# Patient Record
Sex: Male | Born: 2000 | Race: White | Hispanic: No | Marital: Single | Smoking: Never smoker
Health system: Southern US, Community
[De-identification: ages and names within clinical notes are randomized; demographics above are authoritative.]

## PROBLEM LIST (undated history)

## (undated) DIAGNOSIS — G43909 Migraine, unspecified, not intractable, without status migrainosus: Secondary | ICD-10-CM

## (undated) DIAGNOSIS — S62610A Displaced fracture of proximal phalanx of right index finger, initial encounter for closed fracture: Secondary | ICD-10-CM

## (undated) DIAGNOSIS — S62630G Displaced fracture of distal phalanx of right index finger, subsequent encounter for fracture with delayed healing: Secondary | ICD-10-CM

---
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
## 2018-08-03 ENCOUNTER — Emergency Department: Admit: 2018-08-04 | Payer: Medicaid Other

## 2018-08-03 DIAGNOSIS — S62306A Unspecified fracture of fifth metacarpal bone, right hand, initial encounter for closed fracture: Secondary | ICD-10-CM

## 2018-08-03 NOTE — ED Triage Notes (Signed)
Pt became angry & punched the floor with right hand/ Right hand swollen

## 2018-08-03 NOTE — Discharge Instructions (Addendum)
Ice and elevate

## 2018-08-03 NOTE — ED Provider Notes (Signed)
Independent [facility]    HPI:  08/03/18, Time: 10:56 PM         Clevland Cork is a 17 y.o. male presenting to urgency room for sharp constant right hand pain after he punched the floor a few hours prior to arrival.  He is complaining of pain swelling in the area.  He is right-hand dominant he denies any wrist pain weakness or paresthesias.  He does have a history of a boxer's fracture the same hand from several years ago.    Review of Systems:   Pertinent positives and negatives are stated within HPI, all other systems reviewed and are negative.          --------------------------------------------- PAST HISTORY ---------------------------------------------  Past Medical History:  has a past medical history of Migraine.    Past Surgical History:  has no past surgical history on file.    Social History:  reports that he has never smoked. He has never used smokeless tobacco. He reports that he does not drink alcohol or use drugs.    Family History: family history is not on file.     The patient???s home medications have been reviewed.    Allergies: Patient has no known allergies.    -------------------------------------------------- RESULTS -------------------------------------------------  All laboratory and radiology results have been personally reviewed by myself   LABS:  No results found for this visit on 08/03/18.    RADIOLOGY:  Interpreted by Radiologist.  XR HAND RIGHT (MIN 3 VIEWS)   Final Result      Right fifth metacarpal midshaft transverse fracture with mild mild   angulation towards the radial side.             ------------------------- NURSING NOTES AND VITALS REVIEWED ---------------------------   The nursing notes within the ED encounter and vital signs as below have been reviewed.   BP 126/78    Pulse 72    Temp 98.6 ??F (37 ??C) (Oral)    Resp 14    Ht 5\' 9"  (1.753 m)    Wt 130 lb (59 kg)    SpO2 99%    BMI 19.20 kg/m??   Oxygen Saturation Interpretation:  Normal      ---------------------------------------------------PHYSICAL EXAM--------------------------------------      Constitutional/General: Alert and oriented x3, well appearing, non toxic in NAD  Head: Normocephalic and atraumatic  Eyes: PERRL, EOMI  Mouth: Oropharynx clear, handling secretions, no trismus  Neck: Supple, full ROM,   Pulmonary: Lungs clear to auscultation bilaterally, no wheezes, rales, or rhonchi. Not in respiratory distress  Cardiovascular:  Regular rate and rhythm, no murmurs, gallops, or rubs. 2+ distal pulses  Extremities: R hand POP over distal 5th metacarpal with edema and ecchymosis, no finger crossover with flexion, no POP over wrist or snuff box  Skin: warm and dry without rash  Neurologic: GCS 15, speech clear, gait steady  Psych: Normal Affect      ------------------------------ ED COURSE/MEDICAL DECISION MAKING----------------------  Medications - No data to display      ED COURSE:       Medical Decision Making:   Patient appears in no acute distress.  I will x-ray his hand to rule out fracture.  He will be advised to ice elevate the hand to reduce swelling.  Patient placed in a short ulnar gutter OCL splint for fracture support.  He will be referred to orthopedics for continued care.  He was placed on naproxen for pain.    Counseling:   The emergency provider has spoken with  the patient and discussed today???s results, in addition to providing specific details for the plan of care and counseling regarding the diagnosis and prognosis.  Questions are answered at this time and they are agreeable with the plan.      --------------------------------- IMPRESSION AND DISPOSITION ---------------------------------    IMPRESSION  1. Closed nondisplaced fracture of fifth metacarpal bone of right hand, unspecified portion of metacarpal, initial encounter        DISPOSITION  Disposition: Discharge to home  Patient condition is good      NOTE: This report was transcribed using voice recognition  software. Every effort was made to ensure accuracy; however, inadvertent computerized transcription errors may be present       Kagan Mutchler D Marcellene Shivley, PA-C  08/03/18 [id number]

## 2018-08-04 ENCOUNTER — Inpatient Hospital Stay: Admit: 2018-08-04 | Discharge: 2018-08-04 | Disposition: A | Discharge status: Home / Self Care | Payer: Medicaid Other

## 2018-08-04 MED ORDER — NAPROXEN 500 MG PO TABS
500 MG | ORAL_TABLET | Freq: Two times a day (BID) | ORAL | 0 refills | Status: AC
Start: 2018-08-04 — End: 2018-08-10

## 2018-08-04 NOTE — Telephone Encounter (Signed)
Patient needs an ED f/u appt with Dr Ron AgeeGentile for Closed nondisplaced fracture of fifth metacarpal bone of right hand, unspecified portion of metacarpal. [facility]

## 2018-08-05 NOTE — Telephone Encounter (Signed)
Spoke with pts aunt and appt was made for 8/16 at 1000

## 2018-08-08 ENCOUNTER — Ambulatory Visit: Admit: 2018-08-08 | Discharge: 2018-08-08 | Payer: Medicaid Other | Attending: Orthopaedic Surgery

## 2018-08-08 DIAGNOSIS — S62326A Displaced fracture of shaft of fifth metacarpal bone, right hand, initial encounter for closed fracture: Secondary | ICD-10-CM

## 2018-08-08 NOTE — Progress Notes (Signed)
Department of Orthopedic Trauma Surgery  Office History & Physical Exam      CHIEF COMPLAINT:   Chief Complaint   Patient presents with   ??? Follow-Up from Hospital   ??? New Patient     right 5th digit fx, no pain       HISTORY OF PRESENT ILLNESS:                The patient is a 17 y.o. male who presents with right hand pain after punching the floor.  States it happened about a week ago he went to the ED and was placed into a splint.  He states that he has done this in the past and has broken that finger before.  He is right-hand dominant.  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  Denies any numbness tingling paresthesias.  He denies any other orthopedic complaints at this time       Past Medical History:        Diagnosis Date   ??? Migraine      Past Surgical History:    No past surgical history on file.  Current Medications:   No current facility-administered medications for this visit.   Allergies:  Patient has no known allergies.    Social History:   TOBACCO:   reports that he has never smoked. He has never used smokeless tobacco.  ETOH:   reports that he does not drink alcohol.  DRUGS:   reports that he does not use drugs.  ACTIVITIES OF DAILY LIVING:    OCCUPATION:    Family History:   No family history on file.    REVIEW OF SYSTEMS:   Skin: no abnormal pigmentation, rash  Eyes: no blurring or eye pain   Ears/Nose/Throat: no hearing loss, tinnitus  Respiratory: No increased work of breathing, no coughing  Cardiovascular: Brisk capillary refill bilaterally, well perfused extremities  Gastrointestinal: no nausea, vomiting  Neurologic: no paralysis, or seizures  Musculoskeletal: right hand      PHYSICAL EXAM:    VITALS:  BP 106/72 (Site: Left Upper Arm, Position: Sitting, Cuff Size: Large Adult)    Pulse 66    Ht 6' (1.829 m)    Wt 130 lb (59 kg)    BMI 17.63 kg/m??   Constitutional: Oriented to person, place, and time; Answer questions appropriately  HENT: Head: Normocephalic and atraumatic.   Eyes: EOMI,  PEARL  Neck: Supple, trachea midline  Cardiovascular: Brisk capillary refill to all extremities, extremities well perfused  Pulmonary/Chest: No increased work of breathing, no cough  Abdominal: Non-tender, non-distended  Neurologic:  Awake, alert and oriented in three planes. No gross deficits   Musculoskeletal:    Skin intact circumferentially   ?? There is some edema noted over the lateral aspect of the right hand over the fifth metacarpal.  ?? Positive TTP over the fifth metacarpal shaft  ?? Patient able to flex and extend all the fingers however he does have pain with flexion of the fifth finger.  ?? No pain with palpation or flexion extension of the wrist  ?? There is no angular deformity noted to the fifth digit  ?? Sensations intact to light touch in the radial and ulnar digital nerves to the fifth digit  ?? Patient is able to flex and extend the DIP, PIP, CMP of the fifth digit  ?? Sensation intact to light touch in the median, ulnar, radial nerve distributions of the hand  ?? +2/4 radial pulse  ??  Good capillary refill to the fifth digit      DATA:    CBC:   Lab Results   Component Value Date    WBC 10.8 12/01/2012    RBC 5.51 12/01/2012    HGB 15.0 12/01/2012    HCT 44.9 12/01/2012    MCV 81.6 12/01/2012    MCH 27.1 12/01/2012    MCHC 33.3 12/01/2012    RDW 13.6 12/01/2012    PLT 278 12/01/2012    MPV 8.8 12/01/2012     Radiology Review:   X-ray right hand  demonstrating a transverse fifth metacarpal shaft fracture with 20 degrees of anterior angulation.     IMPRESSION:  Right fifth metacarpal shaft fracture    PLAN:  1.  Nonweightbearing to the right upper extremity  2.  Splint was removed today and skin was examined.  A new ulnar gutter splint was applied  3.  Patient continue ice and elevation as needed  4.  Take over-the-counter medicine for pain control  5.  Follow-up in 3 weeks for repeat x-rays of the right hand      Orthopaedic Attending    I have seen and evaluated the patient with the resident and agree  with the above assessments on today's visit. I have performed the key components of the history and physical examination and concur completely with the findings and plans as documented above.    Patient with refracture through right fifth metacarpal.  Fracture line within tolerances.  No clinical digital rotation.  New ulnar gutter splint applied today with better mold.  Patient's wishes to continue with close treatment.  Discussed nonweightbearing.  We will see us back in 3 weeks for repeat evaluation with X OP.    Electronically signed by   Renold GentaJohn Jannett Schmall, DO  08/08/2018

## 2018-08-08 NOTE — Progress Notes (Deleted)
Subjective:      Patient ID: Sean Matthews is a 17 y.o. male.    HPI    Review of Systems    Objective:   Physical Exam    Assessment:      ***      Plan:      ***        Venia Carbonyan M Woodruff Skirvin, DO

## 2018-08-29 ENCOUNTER — Inpatient Hospital Stay: Admit: 2018-08-29 | Payer: Medicaid Other

## 2018-08-29 ENCOUNTER — Ambulatory Visit: Admit: 2018-08-29 | Discharge: 2018-09-02 | Payer: Medicaid Other | Attending: Physician Assistant

## 2018-08-29 DIAGNOSIS — S62326A Displaced fracture of shaft of fifth metacarpal bone, right hand, initial encounter for closed fracture: Secondary | ICD-10-CM

## 2018-08-29 NOTE — Patient Instructions (Signed)
WB:  Non-weight bearing  Brace: On at all times, can remove for bathing and therapy  PT: Attend therapy script provided  Follow up in 4 weeks with XR of the hand

## 2018-09-01 NOTE — Progress Notes (Signed)
Chief Complaint   Patient presents with   . Hand Pain     pt present today for right hand follow up. xrays completed.      DOI: 08/03/18 right 5th metacarpal fracture non op    SUBJECTIVE:  This is a 17 year old male presented to the office for recheck of a 5th metacarpal fracture treated nonoperatively.  Has remained in the splint, nonweightbearing.  Pain is controlled on exam.  Denies any new orthopedic complaints or paresthesias.  Denies any shortness of breath, chest pain, or calf pain.  Denies any constitutional symptoms.    Review of Systems -   General ROS: negative for - chills, fatigue, fever or night sweats  Respiratory ROS: no cough, shortness of breath, or wheezing  Cardiovascular ROS: no chest pain or dyspnea on exertion  Gastrointestinal ROS: no abdominal pain, change in bowel habits, or black or bloody stools  Genitourinary: no hematuria, dysuria, or incontinence   Musculoskeletal ROS:see above  Neurological ROS: no TIA or stroke symptoms       OBJECTIVE:   Alert and oriented X 3, no acute distress, respirations easy and unlabored with no audible wheezes, skin warm and dry, speech and dress appropriate for noted age, affect euthymic.    Extremity:  right Upper Extremity Exam:  Skin intact , not broken down, no signs of infection. No erythema/induration/fluctuence present.   mild swelling present. no ecchymosis present.   no tenderness to palpation over the 5th metcarpal. No palpable crepitus noted over fracture site.  No shortening or malrotation of the 5th finger  Palpable distal pulses, cap refill brisk in all digits. Demonstrates active range of motion of all digits. Motor function intact to anterior interosseous/posterior interosseous/ulnar distributions to hand. Sensation intact to touch in radial/ulnar/median nerve distributions to hand. Compartments supple throughout arm and forearm.    XR: 09/01/18   X-rays obtained of the right hand demonstrating 5th metacarpal fracture with callus formation  noted, no change in fracture alignment.  No acute osseous abnormalities.    BP 128/80   Pulse 60   Ht 5\' 9"  (1.753 m)   Wt 130 lb (59 kg)   BMI 19.20 kg/m     ASSESSMENT:     Diagnosis Orders   1. Closed displaced fracture of shaft of fifth metacarpal bone of right hand, initial encounter  External Referral To Occupational Therapy       PLAN:  Remain NWB  Wrist cock up hand based splint placed on patient to be worn at all times with the exception of bathing and therapy  PT: Attend therapy script provided  Follow up in 4 weeks with XR of the hand    Electronically signed by Renee Rival, PA-C on 09/01/2018 at 9:44 AM

## 2023-10-01 ENCOUNTER — Emergency Department: Admit: 2023-10-02

## 2023-10-01 ENCOUNTER — Inpatient Hospital Stay
Admit: 2023-10-01 | Discharge: 2023-10-02 | Disposition: A | Discharge status: Home / Self Care | Attending: Student in an Organized Health Care Education/Training Program

## 2023-10-01 DIAGNOSIS — S62610A Displaced fracture of proximal phalanx of right index finger, initial encounter for closed fracture: Secondary | ICD-10-CM

## 2023-10-01 NOTE — ED Notes (Signed)
Ice pack was applied.

## 2023-10-01 NOTE — ED Provider Notes (Incomplete)
[facility] EMERGENCY DEPARTMENT  EMERGENCY DEPARTMENT ENCOUNTER        Pt Name: Sean Matthews  MRN: [id number]  Birthdate [birth year]  Date of evaluation: 10/01/2023  Provider: Thelma Barge, DO  PCP: No primary care provider on file.  Note Started: 8:55 PM EDT 10/01/23    CHIEF COMPLAINT       Chief Complaint   Patient presents with    Hand Injury     Right index finger was smashed with a piece of wood at least 120#, happened about 1630       HISTORY OF PRESENT ILLNESS: 1 or more Elements   History From: Patient    Sean Matthews is a 22 y.o. male presents to the emerged part with right hand injury.  Patient states that at approximately 1600 this afternoon his first digit as well as the metacarpal region of the right hand was smashed by a heavy 120 pound plywood board.  Patient states that he only has mild pain at this time, feels like his fingers about to burst, does report small laceration to the anterior and posterior portion of the pointer finger.  Swelling has increased, he states he is not able to move his finger, and that it is not due to pain.  Patient denies all other symptoms.      Nursing Notes were all reviewed and agreed with or any disagreements were addressed in the HPI.      REVIEW OF EXTERNAL NOTES :       PDMP Monitoring:    Last PDMP Alisyn Lequire as Reviewed:  Review User Review Instant Review Result            Urine Drug Screenings (1 yr)    No resulted procedures found.       Medication Contract and Consent for Opioid Use Documents Filed        No documents found                      REVIEW OF SYSTEMS :      Positives and Pertinent negatives as per HPI.     SURGICAL HISTORY     No past surgical history on file.    CURRENTMEDICATIONS       Previous Medications    No medications on file       ALLERGIES       Patient has no known allergies.    FAMILYHISTORY       No family history on file.     SOCIAL HISTORY       Social History     Tobacco Use    Smoking  status: Never    Smokeless tobacco: Never   Vaping Use    Vaping status: Never Used   Substance Use Topics    Alcohol use: No    Drug use: No       SCREENINGS          Glasgow Coma Scale  Eye Opening: Spontaneous  Best Verbal Response: Oriented  Best Motor Response: Obeys commands  Glasgow Coma Scale Score: 15                CIWA Assessment  BP: (!) 140/73  Pulse: 94           PHYSICAL EXAM  1 or more Elements     ED Triage Vitals   BP Systolic BP Percentile Diastolic BP Percentile Temp Temp src Pulse Respirations SpO2  10/01/23 2017 -- -- 10/01/23 1921 -- 10/01/23 1921 10/01/23 2017 10/01/23 1921   (!) 140/73   98.6 F (37 C)  94 16 98 %      Height Weight - Scale         -- 10/01/23 2017          59 kg (130 lb)             {Please review PE section, Press Backspace or delete to continue:***}    Physical Exam  Constitutional:       Appearance: Normal appearance.   Cardiovascular:      Rate and Rhythm: Normal rate and regular rhythm.   Musculoskeletal:         General: Swelling (Right first digit and base of right first digit in the metacarpal region.), tenderness and signs of injury present.      Comments: Compartments soft and compressible   Skin:     General: Skin is warm and dry.      Capillary Refill: Capillary refill takes less than 2 seconds.      Findings: Bruising, erythema and lesion (Laceration on anterior and posterior portion of right first digit.) present.   Neurological:      General: No focal deficit present.      Mental Status: He is alert and oriented to person, place, and time.      Sensory: No sensory deficit.          DIAGNOSTIC RESULTS   LABS:    Labs Reviewed - No data to display    As interpreted by me, the above displayed labs are abnormal. All other labs obtained during this visit were within normal range or not returned as of this dictation.      EKG Interpretation  Interpreted by emergency department physician, Thelma Barge, DO      RADIOLOGY:   Non-plain film images such as CT,  Ultrasound and MRI are read by the radiologist. Plain radiographic images are visualized and preliminarily interpreted by the ED Provider with the below findings:        Interpretation per the Radiologist below, if available at the time of this note:    XR HAND RIGHT (MIN 3 VIEWS)    (Results Pending)     No results found.    No results found.    PROCEDURES     Unless otherwise noted below, none       PAST MEDICAL HISTORY/Chronic Conditions Affecting Care      has a past medical history of Migraine.     EMERGENCY DEPARTMENT COURSE    Vitals:    Vitals:    10/01/23 1921 10/01/23 2017   BP:  (!) 140/73   Pulse: 94    Resp:  16   Temp: 98.6 F (37 C)    SpO2: 98%    Weight:  59 kg (130 lb)       Patient was given the following medications:  Medications   oxyCODONE (ROXICODONE) immediate release tablet 5 mg (5 mg Oral Given 10/01/23 2053)   tetanus & diphtheria toxoids (adult) 5-2 LFU injection 0.5 mL (0.5 mLs IntraMUSCular Given 10/01/23 2054)             Medical Decision Making/Differential Diagnosis:    CC/HPI Summary, Social Determinants of health, Records Reviewed, DDx, testing done/not done, ED Course, Reassessment, disposition considerations/shared decision making with patient, consults, disposition:           Sean Matthews is a  22 y.o. male who presents for ***    CONSULTS: (Who and What was discussed)  None          FINAL IMPRESSION    No diagnosis found.      DISPOSITION/PLAN     DISPOSITION      DISPOSITION  Disposition: {Plan; disposition:60083}  Patient condition is {condition:18240}    10/01/23, 8:55 PM EDT.    Thelma Barge, PGY-1  Emergency Medicine    PATIENT REFERRED TO:  No follow-up provider specified.    DISCHARGE MEDICATIONS:  New Prescriptions    No medications on file       DISCONTINUED MEDICATIONS:  Discontinued Medications    NAPROXEN (NAPROSYN) 500 MG TABLET    Take 1 tablet by mouth 2 times daily for 7 days              (Please note that portions of this note were completed with a  voice recognition program.  Efforts were made to edit the dictations but occasionally words are mis-transcribed.)    Thelma Barge, DO (electronically signed)

## 2023-10-02 ENCOUNTER — Emergency Department: Admit: 2023-10-02

## 2023-10-02 MED ORDER — KETOROLAC TROMETHAMINE 15 MG/ML IJ SOLN
15 | Freq: Once | INTRAMUSCULAR | Status: DC
Start: 2023-10-02 — End: 2023-10-01

## 2023-10-02 MED ORDER — OXYCODONE HCL 5 MG PO TABS
5 | Freq: Once | ORAL | Status: AC
Start: 2023-10-02 — End: 2023-10-01
  Administered 2023-10-02: 01:00:00 5 mg via ORAL

## 2023-10-02 MED ORDER — OXYCODONE HCL 5 MG PO TABS
5 | ORAL_TABLET | Freq: Four times a day (QID) | ORAL | 0 refills | Status: AC | PRN
Start: 2023-10-02 — End: 2023-10-05

## 2023-10-02 MED ORDER — TETANUS-DIPHTHERIA TOXOIDS TD 5-2 LFU IM INJ
5-2 | Freq: Once | INTRAMUSCULAR | Status: AC
Start: 2023-10-02 — End: 2023-10-01
  Administered 2023-10-02: 01:00:00 0.5 mL via INTRAMUSCULAR

## 2023-10-02 MED ORDER — LIDOCAINE HCL 1 % IJ SOLN
1 | Freq: Once | INTRAMUSCULAR | Status: AC
Start: 2023-10-02 — End: 2023-10-02
  Administered 2023-10-02: 05:00:00 10 mL via INTRADERMAL

## 2023-10-02 MED FILL — TENIVAC 5-2 LFU IM INJ: 5-2 LFU | INTRAMUSCULAR | Qty: 0.5

## 2023-10-02 MED FILL — OXYCODONE HCL 5 MG PO TABS: 5 MG | ORAL | Qty: 1

## 2023-10-02 MED FILL — LIDOCAINE HCL 1 % IJ SOLN: 1 % | INTRAMUSCULAR | Qty: 10

## 2023-10-02 MED FILL — KETOROLAC TROMETHAMINE 15 MG/ML IJ SOLN: 15 MG/ML | INTRAMUSCULAR | Qty: 1

## 2023-10-02 NOTE — Discharge Instructions (Addendum)
Please keep splint and buddy tape in place until you see orthopedic surgery.

## 2023-10-14 ENCOUNTER — Ambulatory Visit: Admit: 2023-10-14 | Discharge: 2023-10-14 | Attending: Orthopaedic Surgery

## 2023-10-14 DIAGNOSIS — S62610A Displaced fracture of proximal phalanx of right index finger, initial encounter for closed fracture: Secondary | ICD-10-CM

## 2023-10-14 NOTE — Progress Notes (Signed)
Sean Matthews is a 22 y.o. male, who presents   Chief Complaint   Patient presents with    Follow-up     Right hand inury f/u. Patient states pain is better than it was. States no pain today.      I.  HPI:: Injury occurred 13 days ago.  [name] was working with a Radio broadcast assistant and the board was dropped and he hurt his right index finger.  He went to [facility] and was x-rayed and "reduced" and splinted.  He was told that he had a fracture.  He was apparently supposed to follow-up prior to today but did not have a ride    Allergies; medications; past medical, surgical, family, and social history; and problem list have been reviewed today and updated as indicated in this encounter - see below following Ortho specifics.    Musculoskeletal: The splint was dirty as was his dressing.  He has not had it off since he injured it.  This was removed and his finger was cleaned up.  He does not seem to have any openings that would suggest an open fracture.  He does have a skin abrasion.  There is tenderness to palpation and edema as expected.    Radiologic Studies: Imaging prereduction showed a displaced fracture of the proximal phalanx of the index finger.  "Postreduction" imaging showed displacement of the fracture as well.    ASSESSMENT:  There are no diagnoses linked to this encounter. Treatment alternatives were reviewed including medical and physical therapies, injections, and surgical options, expected risks benefits and likely outcome of each were discussed in detail, questions asked and answered and understood.  We discussed the incident as well as physical findings and imaging results.  This is a displaced fracture which will not heal and its present condition.  This needs to be reduced and fixated.  Malunion or nonunion are risks as well as infection and functional impairment.  These were all discussed with Sean Matthews with parent good understanding.  He would like to have this taken care of.    PLAN: Outpatient  closed versus open reduction and internal fixation proximal phalanx right index finger.        There is no problem list on file for this patient.      Past Medical History:   Diagnosis Date    Migraine        History reviewed. No pertinent surgical history.    No current outpatient medications on file.     No current facility-administered medications for this visit.       No Known Allergies    Social History     Socioeconomic History    Marital status: Single     Spouse name: None    Number of children: None    Years of education: None    Highest education level: None   Tobacco Use    Smoking status: Never    Smokeless tobacco: Never   Vaping Use    Vaping status: Never Used   Substance and Sexual Activity    Alcohol use: No    Drug use: No    Sexual activity: Never       History reviewed. No pertinent family history.      Review of Systems:   As follows except as previously noted in HPI:  Constitutional: Negative for chills, diaphoresis,  fever   Respiratory: Negative for cough, shortness of breath and wheezing.    Cardiovascular: Negative for chest pain and palpitations.  Neurological: Negative for dizziness, syncope,   GI / GU: abdominal pain or cramping  Musculoskeletal: see HPI       Objective:   Physical Exam   Constitutional: Oriented to person, place, and time. and appears well-developed and well-nourished. :   Head: Normocephalic and atraumatic.   Neck: Neck supple.  Eyes: EOM are normal.   Pulmonary/Chest: Effort normal.  No respiratory distress, no wheezes.   Neurological: Alert and oriented to person  Skin: Skin is warm and dry.               Quitman Livings, DO    10/14/23  1:44 PM    All reasonable efforts have been made to minimize the risk of errors that may occur in the use of voice recognition and other electronic means of charting.

## 2023-10-15 ENCOUNTER — Encounter

## 2023-10-15 ENCOUNTER — Ambulatory Visit

## 2023-10-15 NOTE — H&P (Signed)
History and Physical      CHIEF COMPLAINT: Painful deformed right index finger    HISTORY OF PRESENT ILLNESS:      Injured 14 days ago.  Piece of plywood was dropped on it.  Attempted reduction in ER and the finger was splinted.    Past Medical History:        Diagnosis Date    Migraine      Past Surgical History:    No past surgical history on file.  Social History:    TOBACCO:   reports that he has never smoked. He has never used smokeless tobacco.  ETOH:   reports no history of alcohol use.  DRUGS:   reports no history of drug use.  Family History:   No family history on file.  Medications Prior to Admission:  No medications prior to admission.  Allergies:  Patient has no known allergies.    CONSTITUTIONAL:  negative for  chills and anorexia  HEENT:  negative for  tinnitus  RESPIRATORY:  negative for  dyspnea and cyanosis  CARDIOVASCULAR:  negative for  palpitations, syncope  GASTROINTESTINAL:  negative for vomiting and hematemesis  GENITOURINARY:  negative for hematuria  ENDOCRINE:  negative for tremor  MUSCULOSKELETAL: Positive for  joint swelling and decreased range of motion, right index finger  NEUROLOGICAL:  negative for seizures and syncope,    BEHAVIOR/PSYCH:  negative for agitated and anxiety    PHYSICAL EXAM:  There were no vitals taken for this visit.  General appearance:  awake, alert, cooperative, no apparent distress, and appears stated age  Neurologic:  Awake, alert, oriented to name, place and time.  Cranial nerves II-XII are grossly intact.  Motor is 5 out of 5 bilaterally.  Cerebellar finger to nose, heel to shin intact.  Sensory is intact.  Babinski down going, Romberg negative, and gait is normal.  Lungs:  No increased work of breathing, good air exchange, clear to auscultation bilaterally, no crackles or wheezing  Heart:  Normal apical impulse, regular rate and rhythm, normal S1 and S2, no S3 or S4, and no murmur noted  Abdomen:  normal bowel sounds  Skin: warm and dry, no rash or  erythema  ENT: tympanic membrane, external ear and ear canal normal bilaterally, oropharynx clear and moist with normal mucous membranes  Musculoskeletal: Limited range of motion, with joint swelling, deformity and tenderness , right index finger    General Labs:  CBC:   Lab Results   Component Value Date/Time    WBC 10.8 12/01/2012 05:02 PM    RBC 5.51 12/01/2012 05:02 PM    HGB 15.0 12/01/2012 05:02 PM    HCT 44.9 12/01/2012 05:02 PM    MCV 81.6 12/01/2012 05:02 PM    RDW 13.6 12/01/2012 05:02 PM    PLT 278 12/01/2012 05:02 PM     CMP:    Lab Results   Component Value Date/Time    NA 137 12/01/2012 05:02 PM    Jerry Clyne 4.9 12/01/2012 05:02 PM    CL 99 12/01/2012 05:02 PM    CO2 25 12/01/2012 05:02 PM    BUN 16 12/01/2012 05:02 PM     U/A:  No components found for: "UCOLOR", "UCLARITY", "USPGRAV", "UPH", "UPROTEIN", "UGLUCOSE", "UKETONE", "UBILI", "UBLOOD", "UNITRITE", "UUROBIL", "ULEUKEST", "USQEPI", "URENEPI", "UWBC", "URBC", "UBACTERIA", "UHYALINE"    Radiology: Displaced transverse fracture proximal phalanx right index finger    ASSESSMENT AND PLAN:    Closed possible open reduction internal fixation right index finger  Electronically signed by Quitman Livings, DO on 10/15/2023 at 7:43 AM

## 2023-10-21 NOTE — Progress Notes (Signed)
[facility]                                                                                                                    PRE OP INSTRUCTIONS FOR  Sean Matthews        Date: 10/21/2023    Date of surgery: 10/22/23   Arrival Time: Hospital will call you between 5pm and 7pm with your final arrival time for surgery. Go to front of hospital and check in at information desk.    Nothing by mouth (NPO) as instructed. May have clear liquids up to 2 hours prior to surgery. Nothing solid after midnight. Examples: water, apple juice, black coffee, plain tea    Take the following medications with a small sip of water on the morning of Surgery: none     Diabetics may take half the evening dose of insulin but none after midnight.  If you feel symptomatic or have low blood sugar morning of surgery drink 1-2 ounces of apple juice only. If you take a weekly insulin injection _______________, stop 7 days prior to surgery. If you take _______________, stop 3-4 days prior to surgery.    Aspirin, Ibuprofen, Advil, Naproxen, other Anti-inflammatory products should be stopped before surgery as directed by your surgeon, cardiologist, or primary care Doctor. Herbal supplements and Vitamin E should be stopped five days prior.  May take Tylenol unless instructed otherwise by your surgeon.    Check with your Doctor regarding stopping Plavix, Coumadin, Lovenox, Eliquis, Effient, or other blood thinners such as, pradaxa, lixiana, xaralto and savaysa.    Do not smoke, chew tobacco, vape, or use illicit drugs and do not drink any alcoholic beverages 24 hours prior to surgery.    You may brush your teeth the morning of surgery.      You MUST make arrangements for a responsible adult, 18 and over, to take you home after your surgery. You will not be allowed to leave alone or drive yourself home.  You will need someone stay with you the first 24 hrs. Your surgery will be cancelled if you do not have a ride home or  someone to stay with you.    PEDIATRIC PATIENTS ONLY:  A parent/legal guardian must accompany a child scheduled for surgery and plan to stay at the hospital until the child is discharged.  Please do not bring other children with you.    Please wear simple, loose fitting clothing to the hospital.  Do not bring valuables (money, credit cards, checkbooks, etc.) Do not wear any makeup (including no eye makeup) or nail polish on your fingers or toes.    DO NOT wear any jewelry or piercings on day of surgery.  All body piercings and jewelry must be removed.    Shower the [facility] of surgery with an Antibacterial soap.    HYSTERECTOMY patients ONLY - Remember to bring the green Blood Bank bracelet to the hospital on the day of surgery.    If you have  a Living Will and/or Durable Power of Attorney for Healthcare, please bring in a copy.    If appropriate bring crutches, inspirex, walker, cane etc...    Notify your Surgeon if you develop any illness between now and surgery time, cough, cold, fever, sore throat, nausea, vomiting, etc.  Please notify your surgeon if you experience dizziness, shortness of breath or blurred vision between now & the time of your surgery.    If you wear dentures, they will need to be removed before going into surgery, we will provide you with a container. If you wear contact lenses or glasses, they will need to be removed, please bring a case for them.    To provide excellent care visitors will be limited to 1 person in the room at any given time.                                                                                         No children under the age of 69 are permitted in the hospital for the safety of all patients.     Any implantable device requiring remote therapy, Please bring remote day of surgery and bring your implant card with you!      21. If you use a C-PAP please bring settings with you, do not bring the machine.    22. Other:                     Please call AMBULATORY CARE if  you have any further questions.   Pre Admit Testing           [phone]     [facility] [phone]

## 2023-10-22 ENCOUNTER — Encounter

## 2023-10-22 ENCOUNTER — Inpatient Hospital Stay: Admit: 2023-10-22 | Attending: Orthopaedic Surgery

## 2023-10-22 ENCOUNTER — Inpatient Hospital Stay: Attending: Orthopaedic Surgery

## 2023-10-22 DIAGNOSIS — S62610A Displaced fracture of proximal phalanx of right index finger, initial encounter for closed fracture: Secondary | ICD-10-CM

## 2023-10-22 MED ORDER — MEPERIDINE HCL 50 MG/ML IJ SOLN
50 | Freq: Once | INTRAMUSCULAR | Status: DC
Start: 2023-10-22 — End: 2023-10-22

## 2023-10-22 MED ORDER — NALOXONE HCL 0.4 MG/ML IJ SOLN
0.4 | INTRAMUSCULAR | Status: DC | PRN
Start: 2023-10-22 — End: 2023-10-22

## 2023-10-22 MED ORDER — MIDAZOLAM HCL 2 MG/2ML IJ SOLN
2 | INTRAMUSCULAR | Status: AC
Start: 2023-10-22 — End: ?

## 2023-10-22 MED ORDER — STERILE WATER FOR INJECTION (MIXTURES ONLY)
1 | Freq: Three times a day (TID) | INTRAMUSCULAR | Status: DC
Start: 2023-10-22 — End: 2023-10-22
  Administered 2023-10-22: 18:00:00 2000 mg via INTRAVENOUS

## 2023-10-22 MED ORDER — SUGAMMADEX SODIUM 200 MG/2ML IV SOLN
200 | Freq: Once | INTRAVENOUS | Status: DC | PRN
Start: 2023-10-22 — End: 2023-10-22
  Administered 2023-10-22: 19:00:00 200 via INTRAVENOUS

## 2023-10-22 MED ORDER — SUGAMMADEX SODIUM 200 MG/2ML IV SOLN
200 | INTRAVENOUS | Status: AC
Start: 2023-10-22 — End: ?

## 2023-10-22 MED ORDER — FENTANYL CITRATE (PF) 100 MCG/2ML IJ SOLN
100 | Freq: Once | INTRAMUSCULAR | Status: DC | PRN
Start: 2023-10-22 — End: 2023-10-22
  Administered 2023-10-22: 18:00:00 50 via INTRAVENOUS
  Administered 2023-10-22: 18:00:00 100 via INTRAVENOUS

## 2023-10-22 MED ORDER — DEXAMETHASONE SOD PHOSPHATE PF 10 MG/ML IJ SOLN
10 | INTRAMUSCULAR | Status: AC
Start: 2023-10-22 — End: ?

## 2023-10-22 MED ORDER — MIDAZOLAM HCL 2 MG/2ML IJ SOLN
2 | Freq: Once | INTRAMUSCULAR | Status: DC | PRN
Start: 2023-10-22 — End: 2023-10-22
  Administered 2023-10-22: 18:00:00 2 via INTRAVENOUS

## 2023-10-22 MED ORDER — DEXAMETHASONE SOD PHOSPHATE PF 10 MG/ML IJ SOLN
10 | Freq: Once | INTRAMUSCULAR | Status: DC | PRN
Start: 2023-10-22 — End: 2023-10-22
  Administered 2023-10-22: 18:00:00 10 via INTRAVENOUS

## 2023-10-22 MED ORDER — STERILE WATER FOR INJECTION IJ SOLN
INTRAMUSCULAR | Status: DC
Start: 2023-10-22 — End: 2023-10-22

## 2023-10-22 MED ORDER — ASPIRIN 81 MG PO TBEC
81 | ORAL_TABLET | Freq: Two times a day (BID) | ORAL | 0 refills | Status: AC
Start: 2023-10-22 — End: 2023-11-21

## 2023-10-22 MED ORDER — LIDOCAINE HCL (CARDIAC) PF 100 MG/5ML IV SOLN
100 | INTRAVENOUS | Status: AC
Start: 2023-10-22 — End: ?

## 2023-10-22 MED ORDER — PROPOFOL 200 MG/20ML IV EMUL
200 | Freq: Once | INTRAVENOUS | Status: DC | PRN
Start: 2023-10-22 — End: 2023-10-22
  Administered 2023-10-22: 18:00:00 200 via INTRAVENOUS

## 2023-10-22 MED ORDER — LIDOCAINE HCL (CARDIAC) PF 100 MG/5ML IV SOLN
100 | Freq: Once | INTRAVENOUS | Status: DC | PRN
Start: 2023-10-22 — End: 2023-10-22
  Administered 2023-10-22: 18:00:00 60 via INTRAVENOUS

## 2023-10-22 MED ORDER — OXYCODONE-ACETAMINOPHEN 5-325 MG PO TABS
5-325 | Freq: Once | ORAL | Status: DC
Start: 2023-10-22 — End: 2023-10-22

## 2023-10-22 MED ORDER — FENTANYL CITRATE (PF) 100 MCG/2ML IJ SOLN
100 | INTRAMUSCULAR | Status: AC
Start: 2023-10-22 — End: ?

## 2023-10-22 MED ORDER — ONDANSETRON HCL 4 MG/2ML IJ SOLN
4 | Freq: Once | INTRAMUSCULAR | Status: DC | PRN
Start: 2023-10-22 — End: 2023-10-22
  Administered 2023-10-22: 18:00:00 4 via INTRAVENOUS

## 2023-10-22 MED ORDER — NORMAL SALINE FLUSH 0.9 % IV SOLN
0.9 | Freq: Two times a day (BID) | INTRAVENOUS | Status: DC
Start: 2023-10-22 — End: 2023-10-22

## 2023-10-22 MED ORDER — PROPOFOL 200 MG/20ML IV EMUL
200 | INTRAVENOUS | Status: AC
Start: 2023-10-22 — End: ?

## 2023-10-22 MED ORDER — KETOROLAC TROMETHAMINE 30 MG/ML IJ SOLN
30 | Freq: Once | INTRAMUSCULAR | Status: DC | PRN
Start: 2023-10-22 — End: 2023-10-22
  Administered 2023-10-22: 19:00:00 30 via INTRAVENOUS

## 2023-10-22 MED ORDER — LACTATED RINGERS IV SOLN
INTRAVENOUS | Status: DC | PRN
Start: 2023-10-22 — End: 2023-10-22
  Administered 2023-10-22: 18:00:00 via INTRAVENOUS

## 2023-10-22 MED ORDER — ONDANSETRON HCL 4 MG/2ML IJ SOLN
4 | Freq: Once | INTRAMUSCULAR | Status: DC | PRN
Start: 2023-10-22 — End: 2023-10-22

## 2023-10-22 MED ORDER — FENTANYL CITRATE (PF) 50 MCG/ML IJ SOLN
50 | INTRAMUSCULAR | Status: DC | PRN
Start: 2023-10-22 — End: 2023-10-22

## 2023-10-22 MED ORDER — NORMAL SALINE FLUSH 0.9 % IV SOLN
0.9 | INTRAVENOUS | Status: DC | PRN
Start: 2023-10-22 — End: 2023-10-22

## 2023-10-22 MED ORDER — LIDOCAINE HCL 1 % IJ SOLN
1 | INTRAMUSCULAR | Status: AC
Start: 2023-10-22 — End: ?

## 2023-10-22 MED ORDER — HYDROMORPHONE 0.5MG/0.5ML IJ SOLN
1 | Status: DC | PRN
Start: 2023-10-22 — End: 2023-10-22

## 2023-10-22 MED ORDER — LIDOCAINE HCL (PF) 1 % IJ SOLN
1 | INTRAMUSCULAR | Status: DC | PRN
Start: 2023-10-22 — End: 2023-10-22
  Administered 2023-10-22: 19:00:00 10 via INTRADERMAL

## 2023-10-22 MED ORDER — KETOROLAC TROMETHAMINE 30 MG/ML IJ SOLN
30 | INTRAMUSCULAR | Status: AC
Start: 2023-10-22 — End: ?

## 2023-10-22 MED ORDER — SUCCINYLCHOLINE CHLORIDE 200 MG/10ML IV SOSY
200 | INTRAVENOUS | Status: AC
Start: 2023-10-22 — End: ?

## 2023-10-22 MED ORDER — ROCURONIUM BROMIDE 100 MG/10ML IV SOLN
100 | Freq: Once | INTRAVENOUS | Status: DC | PRN
Start: 2023-10-22 — End: 2023-10-22
  Administered 2023-10-22: 18:00:00 10 via INTRAVENOUS
  Administered 2023-10-22: 18:00:00 40 via INTRAVENOUS

## 2023-10-22 MED ORDER — OXYCODONE-ACETAMINOPHEN 5-325 MG PO TABS
5-325 | ORAL_TABLET | Freq: Four times a day (QID) | ORAL | 0 refills | Status: AC | PRN
Start: 2023-10-22 — End: 2023-10-27

## 2023-10-22 MED ORDER — ONDANSETRON HCL 4 MG/2ML IJ SOLN
4 | INTRAMUSCULAR | Status: AC
Start: 2023-10-22 — End: ?

## 2023-10-22 MED ORDER — ROCURONIUM BROMIDE 100 MG/10ML IV SOLN
100 | INTRAVENOUS | Status: AC
Start: 2023-10-22 — End: ?

## 2023-10-22 MED ORDER — MIDAZOLAM HCL 2 MG/2ML IJ SOLN
2 | Freq: Once | INTRAMUSCULAR | Status: DC | PRN
Start: 2023-10-22 — End: 2023-10-22

## 2023-10-22 MED ORDER — CEFAZOLIN SODIUM 2 G IJ SOLR
2 | INTRAMUSCULAR | Status: DC
Start: 2023-10-22 — End: 2023-10-22

## 2023-10-22 MED ORDER — SUCCINYLCHOLINE CHLORIDE 200 MG/10ML IV SOSY
200 | Freq: Once | INTRAVENOUS | Status: DC | PRN
Start: 2023-10-22 — End: 2023-10-22
  Administered 2023-10-22: 18:00:00 80 via INTRAVENOUS

## 2023-10-22 MED ORDER — SODIUM CHLORIDE 0.9 % IV SOLN
0.9 | INTRAVENOUS | Status: DC | PRN
Start: 2023-10-22 — End: 2023-10-22

## 2023-10-22 MED ORDER — LACTATED RINGERS IV SOLN
INTRAVENOUS | Status: DC
Start: 2023-10-22 — End: 2023-10-22
  Administered 2023-10-22: 17:00:00 via INTRAVENOUS

## 2023-10-22 MED ORDER — ASPIRIN 81 MG PO TBEC
81 | ORAL_TABLET | Freq: Two times a day (BID) | ORAL | 0 refills | Status: DC
Start: 2023-10-22 — End: 2023-10-22

## 2023-10-22 MED FILL — CEFAZOLIN SODIUM 2 G IJ SOLR: 2 g | INTRAMUSCULAR | Qty: 2000

## 2023-10-22 MED FILL — CEFAZOLIN SODIUM 1 G IJ SOLR: 1 g | INTRAMUSCULAR | Qty: 1000

## 2023-10-22 MED FILL — DIPRIVAN 200 MG/20ML IV EMUL: 200 MG/20ML | INTRAVENOUS | Qty: 20

## 2023-10-22 MED FILL — STERILE WATER FOR INJECTION IJ SOLN: INTRAMUSCULAR | Qty: 20

## 2023-10-22 MED FILL — LIDOCAINE HCL 1 % IJ SOLN: 1 % | INTRAMUSCULAR | Qty: 10

## 2023-10-22 MED FILL — FENTANYL CITRATE (PF) 100 MCG/2ML IJ SOLN: 100 MCG/2ML | INTRAMUSCULAR | Qty: 2

## 2023-10-22 MED FILL — BRIDION 200 MG/2ML IV SOLN: 200 MG/2ML | INTRAVENOUS | Qty: 2

## 2023-10-22 MED FILL — ONDANSETRON HCL 4 MG/2ML IJ SOLN: 4 MG/2ML | INTRAMUSCULAR | Qty: 2

## 2023-10-22 MED FILL — DEXAMETHASONE SOD PHOSPHATE PF 10 MG/ML IJ SOLN: 10 MG/ML | INTRAMUSCULAR | Qty: 1

## 2023-10-22 MED FILL — SUCCINYLCHOLINE CHLORIDE 200 MG/10ML IV SOSY: 200 MG/10ML | INTRAVENOUS | Qty: 10

## 2023-10-22 MED FILL — LIDOCAINE HCL (CARDIAC) PF 100 MG/5ML IV SOLN: 100 MG/5ML | INTRAVENOUS | Qty: 5

## 2023-10-22 MED FILL — MIDAZOLAM HCL 2 MG/2ML IJ SOLN: 2 MG/ML | INTRAMUSCULAR | Qty: 2

## 2023-10-22 MED FILL — KETOROLAC TROMETHAMINE 30 MG/ML IJ SOLN: 30 MG/ML | INTRAMUSCULAR | Qty: 1

## 2023-10-22 MED FILL — ROCURONIUM BROMIDE 100 MG/10ML IV SOLN: 100 MG/10ML | INTRAVENOUS | Qty: 10

## 2023-10-22 NOTE — Discharge Instructions (Signed)
Take pain medication as prescribed  Take antithrombotic medication as prescribed  Follow up with Dr. Mayford Knife in clinic in 2 weeks  Keep finger covered in a clean dry dressing  No lifting with affected finger

## 2023-10-22 NOTE — Anesthesia Post-Procedure Evaluation (Signed)
Department of Anesthesiology  Postprocedure Note    Patient: Sean Matthews  MRN: [id number]  Birthdate: 09-07-2001  Date of evaluation: 10/22/2023    Procedure Summary       Date: 10/22/23 Room / Location: [facility] OR 02 / [facility]    Anesthesia Start: 1331 Anesthesia Stop: 1451    Procedure: **DO NOT CHANGE TIME 1300 FOR TRANSPORTATION** OPEN REDUCTION INTERNAL FIXATION RIGHT INDEX FINGER (Right) Diagnosis:       Closed displaced fracture of phalanx of right index finger, unspecified phalanx, initial encounter      (Closed displaced fracture of phalanx of right index finger, unspecified phalanx, initial encounter [S62.600A])    Surgeons: Quitman Livings, DO Responsible Provider: Judieth Keens, MD    Anesthesia Type: General ASA Status: 1            Anesthesia Type: General    Aldrete Phase I: Aldrete Score: 9    Aldrete Phase II:      Anesthesia Post Evaluation    Patient location during evaluation: PACU  Patient participation: complete - patient participated  Level of consciousness: awake  Pain score: 2  Airway patency: patent  Nausea & Vomiting: no nausea  Cardiovascular status: blood pressure returned to baseline  Respiratory status: acceptable  Hydration status: euvolemic  Pain management: adequate    No notable events documented.

## 2023-10-22 NOTE — H&P (Signed)
History and Physical      CHIEF COMPLAINT: Right index finger injury    HISTORY OF PRESENT ILLNESS:      History of a crush type injury causing a fracture proximal phalanx right index finger.  This is 6 weeks old.  Patient did not present until 13 days post injury.  Surgery scheduled shortly afterwards.  He was not able to make his surgical appointment and is presently 3 weeks post injury.  He is aware of that there may be healing complications or delays related to the nature of the injury as well as the delay in treatment.    Past Medical History:        Diagnosis Date    Migraine      Past Surgical History:    History reviewed. No pertinent surgical history.  Social History:    TOBACCO:   reports that he has never smoked. He has never used smokeless tobacco.  ETOH:   reports no history of alcohol use.  DRUGS:   reports no history of drug use.  Family History:   History reviewed. No pertinent family history.  Medications Prior to Admission:  No medications prior to admission.  Allergies:  Patient has no known allergies.    CONSTITUTIONAL:  negative for  chills and anorexia  HEENT:  negative for  tinnitus  RESPIRATORY:  negative for  dyspnea and cyanosis  CARDIOVASCULAR:  negative for  palpitations, syncope  GASTROINTESTINAL:  negative for vomiting and hematemesis  GENITOURINARY:  negative for hematuria  ENDOCRINE:  negative for tremor  MUSCULOSKELETAL: Positive for  joint swelling and decreased range of motion, right index finger with swelling and deformity  NEUROLOGICAL:  negative for seizures and syncope,    BEHAVIOR/PSYCH:  negative for agitated and anxiety    PHYSICAL EXAM:  There were no vitals taken for this visit.  General appearance:  awake, alert, cooperative, no apparent distress, and appears stated age  Neurologic:  Awake, alert, oriented to name, place and time.  Cranial nerves II-XII are grossly intact.  Motor is 5 out of 5 bilaterally.  Cerebellar finger to nose, heel to shin intact.  Sensory is  intact.  Babinski down going, Romberg negative, and gait is normal.  Lungs:  No increased work of breathing, good air exchange, clear to auscultation bilaterally, no crackles or wheezing  Heart:  Normal apical impulse, regular rate and rhythm, normal S1 and S2, no S3 or S4, and no murmur noted  Abdomen:  normal bowel sounds  Skin: warm and dry, no rash or erythema  ENT: tympanic membrane, external ear and ear canal normal bilaterally, oropharynx clear and moist with normal mucous membranes  Musculoskeletal: Decreased range of motion,joint swelling, deformity and tenderness , right index finger    General Labs:  CBC:   Lab Results   Component Value Date/Time    WBC 10.8 12/01/2012 05:02 PM    RBC 5.51 12/01/2012 05:02 PM    HGB 15.0 12/01/2012 05:02 PM    HCT 44.9 12/01/2012 05:02 PM    MCV 81.6 12/01/2012 05:02 PM    RDW 13.6 12/01/2012 05:02 PM    PLT 278 12/01/2012 05:02 PM     CMP:    Lab Results   Component Value Date/Time    NA 137 12/01/2012 05:02 PM    Linh Johannes 4.9 12/01/2012 05:02 PM    CL 99 12/01/2012 05:02 PM    CO2 25 12/01/2012 05:02 PM    BUN 16 12/01/2012 05:02 PM  U/A:  No components found for: "UCOLOR", "UCLARITY", "USPGRAV", "UPH", "UPROTEIN", "UGLUCOSE", "UKETONE", "UBILI", "UBLOOD", "UNITRITE", "UUROBIL", "ULEUKEST", "USQEPI", "URENEPI", "UWBC", "URBC", "UBACTERIA", "UHYALINE"    Radiology: Displaced midshaft fracture proximal phalanxright index finger     ASSESSMENT AND PLAN:    Closed possible open reduction internal fixation right index finger      Electronically signed by Quitman Livings, DO on 10/22/2023 at 8:05 AM

## 2023-10-22 NOTE — Anesthesia Pre-Procedure Evaluation (Addendum)
Department of Anesthesiology  Preprocedure Note       Name:  Sean Matthews   Age:  22 y.o.  DOB:  2001-09-25                                          MRN:  [id number]         Date:  10/22/2023      Surgeon: Moishe Spice):  Quitman Livings, DO    Procedure: Procedure(s):  **DO NOT CHANGE TIME 1300 FOR TRANSPORTATION** OPEN REDUCTION INTERNAL FIXATION RIGHT INDEX FINGER    Medications prior to admission:   Prior to Admission medications    Not on File       Current medications:    Current Facility-Administered Medications   Medication Dose Route Frequency Provider Last Rate Last Admin    lactated ringers IV soln infusion SOLN   IntraVENous Continuous Neidig, Jerrye Noble, MD        ceFAZolin (ANCEF) 1,000 mg in sterile water 10 mL IV syringe  1,000 mg IntraVENous Q8H Williams, K Brian, DO           Allergies:  No Known Allergies    Problem List:  There is no problem list on file for this patient.      Past Medical History:        Diagnosis Date    Migraine        Past Surgical History:  History reviewed. No pertinent surgical history.    Social History:    Social History     Tobacco Use    Smoking status: Never    Smokeless tobacco: Never   Substance Use Topics    Alcohol use: No                                Counseling given: Not Answered      Vital Signs (Current): There were no vitals filed for this visit.                                           BP Readings from Last 3 Encounters:   10/01/23 (!) 140/73   08/29/18 128/80   08/08/18 106/72       NPO Status:                                                                                 BMI:   Wt Readings from Last 3 Encounters:   10/14/23 59 kg (130 lb)   10/01/23 59 kg (130 lb)   08/29/18 59 kg (130 lb) (13%, Z= -1.15)*     * Growth percentiles are based on CDC (Boys, 2-20 Years) data.     There is no height or weight on file to calculate BMI.    CBC:   Lab Results   Component Value Date/Time    WBC 10.8 12/01/2012 05:02 PM    RBC 5.51 12/01/2012 05:02  PM     HGB 15.0 12/01/2012 05:02 PM    HCT 44.9 12/01/2012 05:02 PM    MCV 81.6 12/01/2012 05:02 PM    RDW 13.6 12/01/2012 05:02 PM    PLT 278 12/01/2012 05:02 PM       CMP:   Lab Results   Component Value Date/Time    NA 137 12/01/2012 05:02 PM    K 4.9 12/01/2012 05:02 PM    CL 99 12/01/2012 05:02 PM    CO2 25 12/01/2012 05:02 PM    BUN 16 12/01/2012 05:02 PM    CREATININE 0.6 12/01/2012 05:02 PM    GLUCOSE 98 12/01/2012 05:02 PM    CALCIUM 10.4 12/01/2012 05:02 PM    BILITOT 2.3 12/01/2012 05:02 PM    ALKPHOS 221 12/01/2012 05:02 PM    AST 38 12/01/2012 05:02 PM    ALT 19 12/01/2012 05:02 PM       POC Tests: No results for input(s): "POCGLU", "POCNA", "POCK", "POCCL", "POCBUN", "POCHEMO", "POCHCT" in the last 72 hours.    Coags: No results found for: "PROTIME", "INR", "APTT"    HCG (If Applicable): No results found for: "PREGTESTUR", "PREGSERUM", "HCG", "HCGQUANT"     ABGs: No results found for: "PHART", "PO2ART", "PCO2ART", "HCO3ART", "BEART", "O2SATART"     Type & Screen (If Applicable):  No results found for: "ABORH", "LABANTI"    Drug/Infectious Status (If Applicable):  No results found for: "HIV", "HEPCAB"    COVID-19 Screening (If Applicable): No results found for: "COVID19"        Anesthesia Evaluation  Patient summary reviewed   no history of anesthetic complications:   Airway: Mallampati: II  TM distance: >3 FB   Neck ROM: full  Mouth opening: > = 3 FB   Dental: normal exam         Pulmonary:Negative Pulmonary ROS breath sounds clear to auscultation                             Cardiovascular:Negative CV ROS            Rhythm: regular  Rate: normal                    Neuro/Psych:   (+) headaches: migraine headaches            GI/Hepatic/Renal: Neg GI/Hepatic/Renal ROS            Endo/Other: Negative Endo/Other ROS                     ROS comment: Closed displaced fracture of phalanx of right index finger Abdominal:       Abdomen: soft.      Vascular: negative vascular ROS.         Other Findings:              Anesthesia Plan      general     ASA 1     (Chewing gum noon.)  Induction: intravenous and rapid sequence.  BIS  MIPS: Postoperative opioids intended and Prophylactic antiemetics administered.  Anesthetic plan and risks discussed with patient.    Use of blood products discussed with patient whom consented to blood products.    Plan discussed with CRNA.                    DAVID Vergie Living, MD   10/22/2023        Sharion Dove  Alroy Bailiff, APRN - CRNA

## 2023-10-22 NOTE — Op Note (Signed)
Operative Note      Patient: Sean Matthews  Date of Birth: June 28, 2001  MRN: [id number]    Date of Procedure: 11-03-23    Pre-Op Diagnosis Codes:      * Closed displaced fracture of phalanx of right index finger, unspecified phalanx, initial encounter [S62.600A]    Post-Op Diagnosis: Same       Procedure(s):  **DO NOT CHANGE TIME 1300 FOR TRANSPORTATION** OPEN REDUCTION INTERNAL FIXATION RIGHT INDEX FINGER    Surgeon(s):  Quitman Livings, DO    Assistant:   First Assistant: Larwance Rote  Resident: Duke Salvia, DO; Merian Capron, DO    Anesthesia: General    Estimated Blood Loss (mL): Minimal    Complications: None    Specimens:   * No specimens in log *    Implants:  * No implants in log *      Drains: * No LDAs found *    Findings:  Infection Present At Time Of Surgery (PATOS) (choose all levels that have infection present):  No infection present  Other Findings: Completely displaced shaft fracture proximal phalanx right index finger with soft tissue contracture    Detailed Description of Procedure:   The patient was brought the operating room after signs were then applied.  Adequate general anesthetic was administered by anesthesia with the patient supine on the operating table.  The right upper extremity was prepped and draped sterile fashion.    The C-arm was brought in position.  The finger and hand in general were reviewed with the C arm in multiple projections.  The fracture was completely displaced and shortened.  This prompted attempts at closed reduction which were unsuccessful and leading to ultimately open reduction through a mid lateral radial side incision over the proximal phalanx.  Soft tissues were mobilized to allow reduction of the fracture.  Percutaneous Marquerite Forsman wire was then taken from the shoulders of the epicondyles of the proximal phalanx from distal to proximal after the reduction was accomplished.  Multiple attempts necessary because of the small caliber bone and the  contracture.  Ultimately reduction and fixation were successful with good alignment overall.  No rotation remaining and good axial alignment with minimal offset.  2 cross Sean Matthews wires holding this nicely.  These were embedded in the dense cancellous bone at the base of the proximal phalanx and the distal ends were cut and later bent to avoid soft tissue impingement.  The wound was then irrigated and closed in layers with nylon sutures in the skin.  Xeroform gauze and a bulky dressing were applied.  The pins as mentioned were bent and protected from impaling the soft tissues adjacent.  A radial gutter splint was applied protecting the index and middle fingers.  Prior to dressing 10 cc of local anesthetic infiltrated to help with postoperative pain control.  The patient's anesthetic was then reversed and he was taken recovery in stable condition.    All reasonable efforts have been made to minimize the risk of errors that may occur in the use of voice recognition and other electronic means of charting.      Electronically signed by Quitman Livings, DO on 11-03-23 at 2:56 PM

## 2023-11-04 ENCOUNTER — Encounter: Attending: Orthopaedic Surgery

## 2023-11-11 ENCOUNTER — Ambulatory Visit: Admit: 2023-11-11 | Discharge: 2023-11-11 | Attending: Orthopaedic Surgery | Admitting: Orthopaedic Surgery

## 2023-11-11 VITALS — Temp 98.60000°F | Ht 69.0 in | Wt 114.0 lb

## 2023-11-11 DIAGNOSIS — S62610A Displaced fracture of proximal phalanx of right index finger, initial encounter for closed fracture: Secondary | ICD-10-CM

## 2023-11-11 NOTE — Progress Notes (Signed)
 Chief Complaint:   Chief Complaint   Patient presents with    Follow-up     Post op Right index finger ORIF. Patient states he is doing well. Having no pain today.        Sean Matthews follows up 20 days post ORIF proximal phalanx right index finger

## 2023-11-20 ENCOUNTER — Encounter: Admit: 2023-11-20 | Admitting: Orthopaedic Surgery

## 2023-11-20 DIAGNOSIS — S62610A Displaced fracture of proximal phalanx of right index finger, initial encounter for closed fracture: Secondary | ICD-10-CM

## 2023-11-25 ENCOUNTER — Encounter: Admit: 2023-11-25

## 2023-11-25 ENCOUNTER — Encounter: Admit: 2023-11-25 | Admitting: Orthopaedic Surgery

## 2023-11-25 VITALS — Temp 98.60000°F | Ht 69.0 in | Wt 114.0 lb

## 2023-11-25 DIAGNOSIS — S62610A Displaced fracture of proximal phalanx of right index finger, initial encounter for closed fracture: Secondary | ICD-10-CM

## 2023-11-25 NOTE — Progress Notes (Signed)
 Chief Complaint:   Chief Complaint   Patient presents with    Follow-up     Post op R hand ORIF. Patient states he is doing well. Having no pain.        Sean Matthews follows about 5 weeks post op reduction of fixation proximal phalanx right index f

## 2023-12-09 ENCOUNTER — Ambulatory Visit: Admit: 2023-12-09 | Discharge: 2023-12-09 | Attending: Orthopaedic Surgery | Admitting: Orthopaedic Surgery

## 2023-12-09 VITALS — Ht 69.0 in | Wt 114.0 lb

## 2023-12-09 DIAGNOSIS — S62610A Displaced fracture of proximal phalanx of right index finger, initial encounter for closed fracture: Secondary | ICD-10-CM

## 2023-12-09 NOTE — Progress Notes (Signed)
 Chief Complaint:   Chief Complaint   Patient presents with    Hand Pain     Right Hand, DOS 10/22/2023,        Sean Matthews follows about 7 weeks post op ORIF proximal phalanx right index finger.  He is not having any problems with regards to pain.

## 2024-01-01 ENCOUNTER — Ambulatory Visit: Admit: 2024-01-01 | Discharge: 2024-01-01 | Attending: Orthopaedic Surgery

## 2024-01-01 VITALS — Ht 69.0 in | Wt 114.0 lb

## 2024-01-01 DIAGNOSIS — S62610A Displaced fracture of proximal phalanx of right index finger, initial encounter for closed fracture: Secondary | ICD-10-CM

## 2024-01-01 NOTE — Progress Notes (Signed)
Chief Complaint:   Chief Complaint   Patient presents with    Hand Pain     Right Hand, First finger FX F/U DOS 10/22/2023, not able to bend his finger       Sean Matthews follows 10 weeks postop reduction fixation right index finger.  He is doing fairly well.  He is not complaining of discomfort.  He he is not using braces or splints at this point.  He is increased his activities with the right hand little bit.      Allergies; medications; past medical, surgical, family, and social history; and problem list have been reviewed today and updated as indicated in this encounter seen below.    Exam: There is motion to 90 degrees at the MP joint of the index finger.  There is about 40 degrees of DIP motion.  The MP joint is limited to about 30 degrees of flexion.  Extension is functional.  Stability of the index finger is good with no complaints of pain.        ASSESSMENT:    Sean Matthews was seen today for hand pain.    Diagnoses and all orders for this visit:    Closed displaced fracture of proximal phalanx of right index finger, initial encounter        PLAN: Work on range of motion of the PIP joint of the right index finger.  Full range is not expected however there should be some improvement with attention to exercises.    Return if symptoms worsen or fail to improve.       Current Outpatient Medications   Medication Sig Dispense Refill    aspirin 81 MG EC tablet Take 1 tablet by mouth in the morning and at bedtime 60 tablet 0     No current facility-administered medications for this visit.       There is no problem list on file for this patient.      Past Medical History:   Diagnosis Date    Migraine        Past Surgical History:   Procedure Laterality Date    HAND SURGERY Right 10/22/2023    **DO NOT CHANGE TIME 1300 FOR TRANSPORTATION** OPEN REDUCTION INTERNAL FIXATION RIGHT INDEX FINGER performed by Quitman Livings, DO at [facility] OR       No Known Allergies    Social History     Socioeconomic History     Marital status: Single     Spouse name: None    Number of children: None    Years of education: None    Highest education level: None   Tobacco Use    Smoking status: Never    Smokeless tobacco: Never   Vaping Use    Vaping status: Never Used   Substance and Sexual Activity    Alcohol use: No    Drug use: No    Sexual activity: Never       Review of Systems  As follows except as previously noted in HPI:  Constitutional: Negative for chills, diaphoresis, fatigue, fever and unexpected weight change.   Respiratory: Negative for cough, shortness of breath and wheezing.    Cardiovascular: Negative for chest pain and palpitations.   Neurological: Negative for dizziness, syncope, cephalgia.  GI / GU: negative  Musculoskeletal: see HPI       Objective:   Physical Exam   Constitutional: Oriented to person, place, and time. and appears well-developed and well-nourished. :   Head: Normocephalic and atraumatic.  Eyes: EOM are normal.   Neck: Neck supple.   Cardiovascular: Normal rate and regular rhythm.    Pulmonary/Chest: Effort normal. No stridor. No respiratory distress, no wheezes.   Abdominal:  No abnormal distension.    Neurological: Alert and oriented to person, place, and time.   Skin: Skin is warm and dry.   Psychiatric: Normal mood and affect. Behavior is normal. Thought content normal.    Sean Peretz Doristine Church, DO    01/01/24  3:47 PM
